# Patient Record
Sex: Female | Born: 1970 | Race: White | Hispanic: No | Marital: Married | Smoking: Never smoker
Health system: Southern US, Community
[De-identification: ages and names within clinical notes are randomized; demographics above are authoritative.]

## PROBLEM LIST (undated history)

## (undated) DIAGNOSIS — J302 Other seasonal allergic rhinitis: Secondary | ICD-10-CM

## (undated) DIAGNOSIS — F419 Anxiety disorder, unspecified: Secondary | ICD-10-CM

## (undated) DIAGNOSIS — F988 Other specified behavioral and emotional disorders with onset usually occurring in childhood and adolescence: Secondary | ICD-10-CM

## (undated) HISTORY — PX: INSERTION / PLACEMENT / REVISION NEUROSTIMULATOR: SUR720

## (undated) HISTORY — PX: GASTRIC BYPASS: SHX52

## (undated) HISTORY — PX: TUBAL LIGATION: SHX77

## (undated) HISTORY — PX: CHOLECYSTECTOMY: SHX55

---

## 2004-05-27 ENCOUNTER — Encounter (INDEPENDENT_AMBULATORY_CARE_PROVIDER_SITE_OTHER): Payer: Self-pay | Admitting: Specialist

## 2004-05-27 ENCOUNTER — Ambulatory Visit (HOSPITAL_COMMUNITY): Admission: RE | Admit: 2004-05-27 | Discharge: 2004-05-28 | Payer: Self-pay | Admitting: *Deleted

## 2008-11-11 ENCOUNTER — Encounter: Payer: Self-pay | Admitting: Occupational Medicine

## 2008-11-11 ENCOUNTER — Ambulatory Visit: Payer: Self-pay | Admitting: Family Medicine

## 2008-11-11 DIAGNOSIS — S139XXA Sprain of joints and ligaments of unspecified parts of neck, initial encounter: Secondary | ICD-10-CM | POA: Insufficient documentation

## 2008-11-11 DIAGNOSIS — S335XXA Sprain of ligaments of lumbar spine, initial encounter: Secondary | ICD-10-CM | POA: Insufficient documentation

## 2008-11-11 DIAGNOSIS — K219 Gastro-esophageal reflux disease without esophagitis: Secondary | ICD-10-CM | POA: Insufficient documentation

## 2008-11-12 ENCOUNTER — Telehealth (INDEPENDENT_AMBULATORY_CARE_PROVIDER_SITE_OTHER): Payer: Self-pay | Admitting: *Deleted

## 2009-02-28 ENCOUNTER — Ambulatory Visit: Payer: Self-pay | Admitting: Gynecology

## 2009-02-28 ENCOUNTER — Encounter: Payer: Self-pay | Admitting: Gynecology

## 2009-02-28 ENCOUNTER — Other Ambulatory Visit: Admission: RE | Admit: 2009-02-28 | Discharge: 2009-02-28 | Payer: Self-pay | Admitting: Gynecology

## 2009-03-07 ENCOUNTER — Ambulatory Visit: Payer: Self-pay | Admitting: Gynecology

## 2009-03-07 ENCOUNTER — Encounter: Payer: Self-pay | Admitting: Gynecology

## 2009-03-17 ENCOUNTER — Ambulatory Visit: Payer: Self-pay | Admitting: Gynecology

## 2009-03-20 ENCOUNTER — Ambulatory Visit: Payer: Self-pay | Admitting: Gynecology

## 2009-03-20 ENCOUNTER — Ambulatory Visit (HOSPITAL_BASED_OUTPATIENT_CLINIC_OR_DEPARTMENT_OTHER): Admission: RE | Admit: 2009-03-20 | Discharge: 2009-03-20 | Payer: Self-pay | Admitting: Gynecology

## 2009-03-20 ENCOUNTER — Encounter: Payer: Self-pay | Admitting: Gynecology

## 2009-04-02 ENCOUNTER — Ambulatory Visit: Payer: Self-pay | Admitting: Gynecology

## 2009-04-07 ENCOUNTER — Ambulatory Visit: Payer: Self-pay | Admitting: Gynecology

## 2011-02-02 NOTE — H&P (Signed)
Erika Brooks, Erika Brooks                ACCOUNT NO.:  [id number]   MEDICAL RECORD NO.:  [id number]          PATIENT TYPE:  AMB   LOCATION:  [facility]                         FACILITY:  [facility]   PHYSICIAN:  Timothy P. Fontaine, M.D.DATE OF BIRTH:  1971/02/10   DATE OF ADMISSION:  DATE OF DISCHARGE:                              HISTORY & PHYSICAL   DATE OF SURGERY:  July, 10, 2010 at 1 p.m. at [facility].   CHIEF COMPLAINT:  Menorrhagia, endometrial polyp.   HISTORY OF PRESENT ILLNESS:  A 40 year old G2, P2 female status post  tubal sterilization presents complaining of heavier menses.  The  patient's evaluation included ultrasound which showed a posterior polyp  measuring 23x13x10 mm,  endometrial biopsy showing proliferative  endometrium, a normal Pap smear, and normal blood work to include  prolactin and thyroid panel.  The patient is admitted at this time for  hysteroscopy D and C removal of her polyp.   PAST MEDICAL HISTORY:  Migraines.   PAST SURGICAL HISTORY:  Cholecystectomy, tubal sterilization.   CURRENT MEDICATIONS:  Zegerid and Remeron.   ALLERGIES:  No medications.   REVIEW OF SYSTEMS:  Noncontributory.   SOCIAL HISTORY:  Noncontributory.   FAMILY HISTORY:  Noncontributory.   PHYSICAL EXAM:  VITAL SIGNS:  Afebrile. Vital signs stable.  HEENT: Normal.  LUNGS:  Clear.  CARDIAC:  Regular rate.  No rubs, murmurs or gallops.  ABDOMEN:  Exam benign.  PELVIC:  External BUS, vagina normal.  Cervix normal.  Uterus of normal  size, midline and mobile. Adnexa without masses or tenderness.   ASSESSMENT:  A 40 year old G2, P2 female worsening menses,  sonohysterogram suggestive of endometrial polyp for hysteroscopy D and  C.  I reviewed the proposed surgery with the patient to include what is  involved with instrumentation, use of hysteroscope, resectoscope, D and  C portion, and the risks of infection, prolonged antibiotics, hemorrhage  necessitating  transfusion, risks of transfusion, uterine perforation,  damage to internal organs, bowel, bladder, ureters, vessels and nerves  necessitating major exploratory reparative surgeries, future reparative  surgeries, including bowel resection, bladder repair, ureteral damage  repair, and ostomy formation were all  discussed, understood and accepted.  The risks of distended media  absorption leading to metabolic complications such as coma or seizures  were also reviewed, understood and accepted.  The patient's questions  were answered to her satisfaction.  She is ready to proceed with  surgery.      Timothy P. Fontaine, M.D.  Electronically Signed     TPF/MEDQ  D:  03/19/2009  T:  03/20/2009  Job:  [id number]

## 2011-02-02 NOTE — Op Note (Signed)
NAMEAYSIA, LOWDER                ACCOUNT NO.:  [id number]   MEDICAL RECORD NO.:  [id number]          PATIENT TYPE:  AMB   LOCATION:  [facility]                         FACILITY:  [facility]   PHYSICIAN:  Timothy P. Fontaine, M.D.DATE OF BIRTH:  [birth year]   DATE OF PROCEDURE:  03/20/2009  DATE OF DISCHARGE:                               OPERATIVE REPORT   PREOPERATIVE DIAGNOSES:  1. Menorrhagia.  2. Endometrial polyp.   POSTOPERATIVE DIAGNOSES:  1. Menorrhagia.  2. Endometrial polyp.   PROCEDURES:  1. Hysteroscopy.  2. Endometrial polypectomy.  3. Dilatation and curettage.   SURGEON:  Timothy P. Fontaine, M.D.   ANESTHETIC:  General.   ESTIMATED BLOOD LOSS:  Minimal.   SORBITOL DISCREPANCY:  Minimal.   COMPLICATIONS:  None.   SPECIMEN:  1. Endometrial curetting.  2. Endometrial polyp to pathology.   FINDINGS:  EUA:  External, BUS, vagina normal.  Cervix normal.  Bimanual:  Uterus normal size, midline and mobile.  Adnexa without  masses.  Hysteroscopic with broad-based posterior lower uterine segment  endometrial polyp with a smaller, narrow-based polyp also noted in the  lower posterior endometrial surface.  Hysteroscopy was otherwise normal,  noting fundus, anterior-posterior uterine surfaces, right and left tubal  ostia, lower uterine segment and endocervical canal all visualized.   PROCEDURE:  The patient was taken to the operating room, underwent  general anesthesia, was placed in low dorsal lithotomy position,  received a perineal, vaginal preparation with Betadine solution.  EUA  performed and the patient was draped in the usual fashion, having voided  immediately previous to the surgery.  Cervix was visualized with a  weighted speculum, anterior lip grasped with a single-tooth tenaculum  and a paracervical block using 1% lidocaine was placed, a total of 9 cc.  Cervix was gently gradually dilated to admit the operative hysteroscope  and hysteroscopy was performed  with findings noted above.  The polyps  were resected to the level of the surrounding endometrium using the  right-angle resectoscope loop in several passes.  These were sent to  pathology.  A sharp curettage was then performed, the specimen sent to  pathology.  Re-hysteroscopy showed an empty cavity, good distention, no  evidence of perforation.  The patient was placed in the supine position  after removal of the tenaculum and speculum, showing adequate  hemostasis.  She was awakened without difficulty.  She received  intraoperative Toradol was taken to the recovery room in good condition,  having tolerated the procedure well.      Timothy P. Fontaine, M.D.  Electronically Signed     TPF/MEDQ  D:  03/20/2009  T:  03/20/2009  Job:  [id number]

## 2011-02-05 NOTE — Op Note (Signed)
[name], Erika Brooks                            ACCOUNT NO.:  [id number]   MEDICAL RECORD NO.:  [id number]                   PATIENT TYPE:  OIB   LOCATION:  2550                                 FACILITY:  [facility]   PHYSICIAN:  Vikki Ports, M.D.         DATE OF BIRTH:  [birth year]   DATE OF PROCEDURE:  05/27/2004  DATE OF DISCHARGE:                                 OPERATIVE REPORT   PREOPERATIVE DIAGNOSIS:  Atypical biliary colic.   POSTOPERATIVE DIAGNOSIS:  Atypical biliary colic, normal cholangiogram.   PROCEDURE:  Laparoscopic cholecystectomy with intraoperative cholangiogram.   SURGEON:  Vikki Ports, M.D.   ASSISTANT:  Jimmye Norman, M.D.   ANESTHESIA:  General.   DESCRIPTION OF PROCEDURE:  The patient was taken to the operating room and  placed in the supine position.  After adequate general anesthesia was  induced using an endotracheal tube, the abdomen was prepped and draped in  the normal sterile fashion.  Using a transverse central umbilical incision,  I dissected down to the fascia.  The fascia was opened vertically.  An 0  Vicryl pursestring suture was placed on the fascial defect.  Hassan trocar  was placed in the abdomen and pneumoperitoneum was obtained.  Under direct  visualization, an 11 mm trocar was placed in the subxiphoid region.  Two 5  mm ports were placed in the right abdomen.  Gallbladder was identified, did  not appear pathologic.  Dissection of the neck began by isolating the cystic  duct creating a good window posterior to it, clipping it on the gallbladder,  making a small ductotomy and performing cholangiogram.  This showed filling  of common bile duct, hepatic duct, and all biliary radicals with free flow  into the duodenum.  There were no filling defects.  Cholangi catheter was  removed.  Cystic duct was then doubly clipped and divided.  Cystic artery  was identified in a similar fashion, clipped and divided.  Gallbladder was  taken  off the gallbladder bed using Bovie electrocautery, placed in an  EndoCatch bag and removed through the umbilical port.  Pneumoperitoneum was  released after adequate hemostasis was insured.  Infraumbilical fascial  defect was closed with the O Vicryl pursestring sutures.  Skin incisions  were closed with subcuticular 4-0 Monocryl.  Steri-Strips and sterile  dressings were applied.  The patient tolerated the procedure well and went  to PACU in good condition.                                               Vikki Ports, M.D.    KRH/MEDQ  D:  05/27/2004  T:  05/27/2004  Job:  [id number]

## 2015-07-15 ENCOUNTER — Other Ambulatory Visit (HOSPITAL_BASED_OUTPATIENT_CLINIC_OR_DEPARTMENT_OTHER): Payer: Self-pay | Admitting: Family Medicine

## 2015-07-15 DIAGNOSIS — Z1231 Encounter for screening mammogram for malignant neoplasm of breast: Secondary | ICD-10-CM

## 2015-07-18 ENCOUNTER — Ambulatory Visit (HOSPITAL_BASED_OUTPATIENT_CLINIC_OR_DEPARTMENT_OTHER): Payer: Self-pay

## 2019-05-11 ENCOUNTER — Encounter: Payer: Self-pay | Admitting: Emergency Medicine

## 2019-05-11 ENCOUNTER — Other Ambulatory Visit: Payer: Self-pay

## 2019-05-11 ENCOUNTER — Emergency Department (INDEPENDENT_AMBULATORY_CARE_PROVIDER_SITE_OTHER)
Admission: EM | Admit: 2019-05-11 | Discharge: 2019-05-11 | Disposition: A | Discharge status: Home / Self Care | Payer: 59 | Source: Home / Self Care

## 2019-05-11 DIAGNOSIS — L282 Other prurigo: Secondary | ICD-10-CM

## 2019-05-11 HISTORY — DX: Anxiety disorder, unspecified: F41.9

## 2019-05-11 HISTORY — DX: Other seasonal allergic rhinitis: J30.2

## 2019-05-11 HISTORY — DX: Other specified behavioral and emotional disorders with onset usually occurring in childhood and adolescence: F98.8

## 2019-05-11 MED ORDER — HYDROXYZINE HCL 25 MG PO TABS: 25.0000 mg | ORAL_TABLET | Freq: Four times a day (QID) | ORAL | 0 refills | Status: DC

## 2019-05-11 MED ORDER — PREDNISONE 50 MG PO TABS: 50.0000 mg | ORAL_TABLET | Freq: Every day | ORAL | 0 refills | Status: AC

## 2019-05-11 MED ORDER — DEXAMETHASONE SODIUM PHOSPHATE 10 MG/ML IJ SOLN
10.0000 mg | Freq: Once | INTRAMUSCULAR | Status: AC
  Administered 2019-05-11: 10 mg via INTRAMUSCULAR

## 2019-05-11 NOTE — ED Triage Notes (Signed)
Patient was treated for rash 2 weeks ago; just completed 9 day course of prednisone; 2 days ago rash re-appeared on back of neck and has spread; itching despite benadryl. Reports numerous derm allergies. She has not travelled past 4 weeks.

## 2019-05-11 NOTE — ED Provider Notes (Signed)
[facility]    CSN: [id number] Arrival date & time: 05/11/19  1013      History   Chief Complaint Chief Complaint  Patient presents with  . Rash    HPI Erika Brooks is a 48 y.o. female.   HPI Erika Brooks is a 48 y.o. female presenting to UC with c/o 2 days of itchy red rash that initially started 2 weeks ago after borrowing a coworker's sweater and believes she had a reaction because the sweater was washed in [place]. Pt has sensitive skin. She completed a 9 day course of prednisone, 3 pills once daily (pt unsure of the dose). Rash and itching returned shortly after completing the medication. She has taken benadryl and used cortisone cream w/o relief. Itching is all over the back of her neck into the back of her scalp and both ears. No other new soaps, lotions or medications. She does recall getting a new lounging pillow and was unable to pre-wash it before using due to the type of pillow it was but she has since given it to her daughter in case that was the cause of her rash. Denies fever, chills, sore or scratchy throat, SOB or oral swelling.    Past Medical History:  Diagnosis Date  . ADD (attention deficit disorder)   . Anxiety   . Seasonal allergies     Patient Active Problem List   Diagnosis Date Noted  . GERD 11/11/2008  . NECK SPRAIN AND STRAIN 11/11/2008  . LUMBAR SPRAIN AND STRAIN 11/11/2008    Past Surgical History:  Procedure Laterality Date  . CHOLECYSTECTOMY    . GASTRIC BYPASS    . INSERTION / PLACEMENT / REVISION NEUROSTIMULATOR    . TUBAL LIGATION      OB History   No obstetric history on file.      Home Medications    Prior to Admission medications   Medication Sig Start Date End Date Taking? Authorizing Provider  amphetamine-dextroamphetamine (ADDERALL) 10 MG tablet Take 10 mg by mouth daily with breakfast.   Yes [provider]  amphetamine-dextroamphetamine (ADDERALL) 5 MG tablet Take 5 mg by mouth daily.   Yes [provider]  cholecalciferol (VITAMIN D3) 25 MCG (1000 UT) tablet Take 50,000 Units by mouth daily.   Yes [provider]  Cyanocobalamin (VITAMIN B 12) 250 MCG LOZG Inject into the muscle.   Yes [provider]  DULoxetine (CYMBALTA) 60 MG capsule Take 120 mg by mouth daily.   Yes [provider]  guanFACINE (TENEX) 2 MG tablet Take 2 mg by mouth at bedtime.   Yes [provider]  linaclotide (LINZESS) 145 MCG CAPS capsule Take 145 mcg by mouth daily before breakfast.   Yes [provider]  loratadine (CLARITIN) 10 MG tablet Take 10 mg by mouth daily.   Yes [provider]  QUEtiapine (SEROQUEL) 100 MG tablet Take 150 mg by mouth at bedtime.   Yes [provider]  tiZANidine (ZANAFLEX) 4 MG capsule Take 4 mg by mouth 3 (three) times daily.   Yes [provider]  vorinostat (ZOLINZA) 100 MG capsule Take 400 mg by mouth daily. Take with food.   Yes [provider]  zaleplon (SONATA) 10 MG capsule Take 10 mg by mouth at bedtime as needed for sleep.   Yes [provider]  zolpidem (AMBIEN CR) 12.5 MG CR tablet Take 12.5 mg by mouth at bedtime as needed for sleep.   Yes [provider]  hydrOXYzine (ATARAX/VISTARIL) 25 MG tablet Take 1 tablet (25 mg total) by mouth every 6 (six) hours. 05/11/19   Noe Gens, PA-C  predniSONE (DELTASONE) 50 MG tablet Take 1 tablet (50 mg total) by mouth daily with breakfast for 5 days. 05/11/19 05/16/19  Noe Gens, PA-C    Family History No family history on file.  Social History Social History   Tobacco Use  . Smoking status: Never Smoker  . Smokeless tobacco: Never Used  Substance Use Topics  . Alcohol use: Not Currently  . Drug use: Not on file     Allergies   Patient has no known allergies.   Review of Systems Review of Systems  Constitutional: Negative for chills and fever.  HENT: Negative for sore throat and trouble swallowing.    Respiratory: Negative for chest tightness, shortness of breath and wheezing.   Gastrointestinal: Negative for nausea and vomiting.  Musculoskeletal: Negative for arthralgias, joint swelling and myalgias.  Skin: Positive for rash.     Physical Exam Triage Vital Signs ED Triage Vitals  Enc Vitals Group     BP 05/11/19 1204 131/84     Pulse Rate 05/11/19 1204 74     Resp 05/11/19 1204 18     Temp 05/11/19 1204 98.1 F (36.7 C)     Temp Source 05/11/19 1204 Oral     SpO2 05/11/19 1204 100 %     Weight 05/11/19 1205 189 lb (85.7 kg)     Height 05/11/19 1205 5\' 9"  (1.753 m)     Head Circumference --      Peak Flow --      Pain Score 05/11/19 1204 0     Pain Loc --      Pain Edu? --      Excl. in [place]? --    No data found.  Updated Vital Signs BP 131/84 (BP Location: Right Arm)   Pulse 74   Temp 98.1 F (36.7 C) (Oral)   Resp 18   Ht 5\' 9"  (1.753 m)   Wt 189 lb (85.7 kg)   SpO2 100%   BMI 27.91 kg/m   Visual Acuity Right Eye Distance:   Left Eye Distance:   Bilateral Distance:    Right Eye Near:   Left Eye Near:    Bilateral Near:     Physical Exam Vitals signs and nursing note reviewed.  Constitutional:      Appearance: Normal appearance. She is well-developed.  HENT:     Head: Normocephalic and atraumatic.     Right Ear: Tympanic membrane and ear canal normal.     Left Ear: Tympanic membrane and ear canal normal.     Nose: Nose normal.     Mouth/Throat:     Lips: Pink.     Mouth: Mucous membranes are moist.     Pharynx: Oropharynx is clear. Uvula midline.     Comments: Bilateral external ears: faint erythematous fine papular rash. No blisters.  Non-tender. No oral swelling. Neck:     Musculoskeletal: Normal range of motion.  Cardiovascular:     Rate and Rhythm: Normal rate and regular rhythm.  Pulmonary:     Effort: Pulmonary effort is normal. No respiratory distress.     Breath sounds: Normal breath sounds. No stridor. No wheezing.  Musculoskeletal:  Normal range of motion.  Skin:    General: Skin is warm and dry.     Findings: Erythema and rash present.     Comments: Posterior neck and  along hairline of base of scalp: fine erythematous papular rash. Does blanch. Non-tender.   Neurological:     Mental Status: She is alert and oriented to person, place, and time.  Psychiatric:        Behavior: Behavior normal.      UC Treatments / Results  Labs (all labs ordered are listed, but only abnormal results are displayed) Labs Reviewed - No data to display  EKG   Radiology No results found.  Procedures Procedures (including critical care time)  Medications Ordered in UC Medications  dexamethasone (DECADRON) injection 10 mg (10 mg Intramuscular Given 05/11/19 1213)    Initial Impression / Assessment and Plan / UC Course  I have reviewed the triage vital signs and the nursing notes.  Pertinent labs & imaging results that were available during my care of the patient were reviewed by me and considered in my medical decision making (see chart for details).     Pt given decadron 10mg  IM Will also discharge with another 5 days of prednisone 50mg  once daily And atarax for severe itching AVS provided.  Final Clinical Impressions(s) / UC Diagnoses   Final diagnoses:  Pruritic rash     Discharge Instructions      You were given a shot of decadron (a steroid) today to help with itching and rash from a likely allergic reaction.  You have been prescribed 5 days of prednisone, an oral steroid.  You may start this medication tomorrow with breakfast.    Atarax (hydroxizine) is an antihistamine that can be taken to help with itching. This medication can cause drowsiness so do not drive or drink alcohol while taking.    You may try soaking in an oatmeal bath or using [place] bath soak to help relieve some itching. Be sure to use warm, rather than hot water, and pat dry your skin, do not rub as this can cause further skin irritation.    Please follow up with your family doctor next week if still not improving.    ED Prescriptions    Medication Sig Dispense Auth. Provider   predniSONE (DELTASONE) 50 MG tablet Take 1 tablet (50 mg total) by mouth daily with breakfast for 5 days. 5 tablet Waylan RocherPhelps, Roberta Kelly O, PA-C   hydrOXYzine (ATARAX/VISTARIL) 25 MG tablet Take 1 tablet (25 mg total) by mouth every 6 (six) hours. 12 tablet Lurene ShadowPhelps, Andrews Tener O, PA-C     Controlled Substance Prescriptions [place] Controlled Substance Registry consulted? Not Applicable   Rolla Platehelps, Alexandera Kuntzman O, PA-C 05/11/19 1744

## 2019-05-11 NOTE — Discharge Instructions (Signed)
°  You were given a shot of decadron (a steroid) today to help with itching and rash from a likely allergic reaction.  You have been prescribed 5 days of prednisone, an oral steroid.  You may start this medication tomorrow with breakfast.    Atarax (hydroxizine) is an antihistamine that can be taken to help with itching. This medication can cause drowsiness so do not drive or drink alcohol while taking.    You may try soaking in an oatmeal bath or using [place] bath soak to help relieve some itching. Be sure to use warm, rather than hot water, and pat dry your skin, do not rub as this can cause further skin irritation.   Please follow up with your family doctor next week if still not improving.

## 2022-03-17 ENCOUNTER — Emergency Department (INDEPENDENT_AMBULATORY_CARE_PROVIDER_SITE_OTHER)
Admission: EM | Admit: 2022-03-17 | Discharge: 2022-03-17 | Disposition: A | Discharge status: Home / Self Care | Payer: 59 | Source: Home / Self Care | Attending: Family Medicine | Admitting: Family Medicine

## 2022-03-17 ENCOUNTER — Encounter: Payer: Self-pay | Admitting: Emergency Medicine

## 2022-03-17 DIAGNOSIS — M7918 Myalgia, other site: Secondary | ICD-10-CM

## 2022-03-17 DIAGNOSIS — M542 Cervicalgia: Secondary | ICD-10-CM

## 2022-03-17 MED ORDER — METHYLPREDNISOLONE 4 MG PO TBPK: ORAL_TABLET | ORAL | 0 refills | Status: AC

## 2022-03-17 MED ORDER — TIZANIDINE HCL 4 MG PO CAPS: 4.0000 mg | ORAL_CAPSULE | Freq: Three times a day (TID) | ORAL | 0 refills | Status: AC | PRN

## 2022-03-17 NOTE — ED Triage Notes (Signed)
Pain to right side of neck Friday night  Pt states she feels like she turned wrong & maybe pulled a muscle  Pt can not take ibuprofen due to gastric bypass Pt has been using a travel pillow Pain is better but tylenol is providing minimal relief  Pt will need a work note  Works as a Lawyer w/ the elderly

## 2022-03-17 NOTE — Discharge Instructions (Signed)
Take the Medrol Dosepak as directed.  This is a steroid anti-inflammatory.  Take all of day 1 today (3 now and then 3 at bedtime.  Take the steroids with food Take Zanaflex muscle relaxer as needed Ice to area See your doctor if not improving by next week

## 2022-03-17 NOTE — ED Provider Notes (Signed)
[facility]    CSN: [id number] Arrival date & time: 03/17/22  1754      History   Chief Complaint Chief Complaint  Patient presents with   Neck Pain    Right side     HPI Erika Brooks is a 51 y.o. female.   HPI  Patient is here for neck pain.  Is been going on for 3 to 4 days.  On the right side of her neck.  It is in the neck muscles on upper part of her shoulder (points to trapezius.  Has pain with neck movement.  Pain with lifting and activities of her arms.  On the right side only.  No numbness or weakness into the arm.  No history of degenerative disc disease or known neck condition.  Past medical history, taken from chart, includes paroxysmal supraventricular tachycardia, gastroesophageal disease without esophagitis, history of gastric bypass, migraines, chronic pain syndrome, myofascial pain, iron deficiency anemia multiple joint pain lumbar sacral spondylosis and cervical facet joint syndrome  Medications are listed below  Past Medical History:  Diagnosis Date   ADD (attention deficit disorder)    Anxiety    Seasonal allergies     Patient Active Problem List   Diagnosis Date Noted   GERD 11/11/2008   NECK SPRAIN AND STRAIN 11/11/2008   LUMBAR SPRAIN AND STRAIN 11/11/2008    Past Surgical History:  Procedure Laterality Date   CHOLECYSTECTOMY     GASTRIC BYPASS     INSERTION / PLACEMENT / REVISION NEUROSTIMULATOR     TUBAL LIGATION      OB History   No obstetric history on file.      Home Medications    Prior to Admission medications   Medication Sig Start Date End Date Taking? Authorizing Provider  diclofenac Sodium (VOLTAREN) 1 % GEL PLACE ONTO THE SKIN 4 (FOUR) TIMES A DAY AS NEEDED. 05/08/15  Yes [provider]  estradiol (CLIMARA - DOSED IN MG/24 HR) 0.05 mg/24hr patch Place onto the skin. 07/28/21  Yes [provider]  estrogen, conjugated,-medroxyprogesterone (PREMPRO) 0.625-5 MG tablet Take 1 tablet by mouth  daily. 05/12/20  Yes [provider]  lisinopril (ZESTRIL) 40 MG tablet Take by mouth. 09/24/20  Yes [provider]  methylPREDNISolone (MEDROL DOSEPAK) 4 MG TBPK tablet tad 03/17/22  Yes Eustace Moore, MD  metoprolol succinate (TOPROL-XL) 25 MG 24 hr tablet Take 1 tablet by mouth daily. 12/28/21  Yes [provider]  Multiple Vitamin (THERA) TABS Take by mouth. 01/05/16  Yes [provider]  omeprazole (PRILOSEC) 40 MG capsule Take by mouth. 04/11/15  Yes [provider]  progesterone (PROMETRIUM) 100 MG capsule Take by mouth. 07/28/21  Yes [provider]  Sodium Fluoride (SODIUM FLUORIDE 5000 PPM) 1.1 % PSTE Brush twice daily and spit. Do not swallow. 05/11/21  Yes [provider]  amantadine (SYMMETREL) 100 MG capsule Take by mouth. 02/17/22   [provider]  amphetamine-dextroamphetamine (ADDERALL) 10 MG tablet Take 10 mg by mouth daily with breakfast.    [provider]  amphetamine-dextroamphetamine (ADDERALL) 5 MG tablet Take 5 mg by mouth daily.    [provider]  cholecalciferol (VITAMIN D3) 25 MCG (1000 UT) tablet Take 50,000 Units by mouth daily.    [provider]  Cyanocobalamin (VITAMIN B 12) 250 MCG LOZG Inject into the muscle.    [provider]  DULoxetine (CYMBALTA) 60 MG capsule Take 120 mg by mouth daily.    [provider]  estradiol (ESTRACE) 0.1 MG/GM vaginal cream Place vaginally. 12/07/21   [provider]  Lavender Oil OIL by Misc.(Non-Drug; Combo Route) route.    [provider]  loratadine (CLARITIN) 10 MG tablet Take 10 mg by mouth daily.    [provider]  QUEtiapine (SEROQUEL) 100 MG tablet Take 150 mg by mouth at bedtime.    [provider]  SUMAtriptan (IMITREX) 25 MG tablet SMARTSIG:1 Tablet(s) By Mouth Every 2 Hours PRN 02/17/22   [provider]  tiZANidine (ZANAFLEX) 4 MG capsule Take 1 capsule (4 mg  total) by mouth 3 (three) times daily as needed for muscle spasms. 03/17/22   Eustace Moore, MD  zolpidem (AMBIEN CR) 12.5 MG CR tablet Take 12.5 mg by mouth at bedtime as needed for sleep.    [provider]    Family History Family History  Problem Relation Age of Onset   Hypertension Mother    High Cholesterol Mother    Hypertension Father    High Cholesterol Father    Diverticulitis Father    Lung cancer Father     Social History Social History   Tobacco Use   Smoking status: Never    Passive exposure: Never   Smokeless tobacco: Never  Vaping Use   Vaping Use: Never used  Substance Use Topics   Alcohol use: Not Currently   Drug use: Never     Allergies   Morphine, Nsaids, and Nitrofurantoin   Review of Systems Review of Systems  See HPI Physical Exam Triage Vital Signs ED Triage Vitals  Enc Vitals Group     BP 03/17/22 1809 (!) 138/91     Pulse Rate 03/17/22 1809 75     Resp 03/17/22 1809 16     Temp 03/17/22 1809 99.4 F (37.4 C)     Temp Source 03/17/22 1809 Oral     SpO2 03/17/22 1809 98 %     Weight 03/17/22 1812 200 lb (90.7 kg)     Height 03/17/22 1812 5\' 9"  (1.753 m)     Head Circumference --      Peak Flow --      Pain Score 03/17/22 1811 6     Pain Loc --      Pain Edu? --      Excl. in GC? --    No data found.  Updated Vital Signs BP (!) 138/91 (BP Location: Left Arm)   Pulse 75   Temp 99.4 F (37.4 C) (Oral)   Resp 16   Ht 5\' 9"  (1.753 m)   Wt 90.7 kg   SpO2 98%   BMI 29.53 kg/m  :     Physical Exam Constitutional:      General: She is not in acute distress.    Appearance: She is well-developed.     Comments: Appears uncomfortable.  Guarded movements  HENT:     Head: Normocephalic and atraumatic.  Eyes:     Conjunctiva/sclera: Conjunctivae normal.     Pupils: Pupils are equal, round, and reactive to light.  Neck:     Comments: Tenderness in the right paraspinous cervical and right upper body of trapezius  down the medial border of the right scapula.  No bony tenderness.  Neck range of motion is slow but full Cardiovascular:     Rate and Rhythm: Normal rate.  Pulmonary:     Effort: Pulmonary effort is normal. No respiratory distress.  Abdominal:     General: There is  no distension.     Palpations: Abdomen is soft.  Musculoskeletal:        General: Normal range of motion.     Cervical back: Normal range of motion. Tenderness present.     Comments: Upper extremities have symmetric strength sensation range of motion and reflexes  Skin:    General: Skin is warm and dry.  Neurological:     General: No focal deficit present.     Mental Status: She is alert.  Psychiatric:        Mood and Affect: Mood normal.        Behavior: Behavior normal.      UC Treatments / Results  Labs (all labs ordered are listed, but only abnormal results are displayed) Labs Reviewed - No data to display  EKG   Radiology No results found.  Procedures Procedures (including critical care time)  Medications Ordered in UC Medications - No data to display  Initial Impression / Assessment and Plan / UC Course  I have reviewed the triage vital signs and the nursing notes.  Pertinent labs & imaging results that were available during my care of the patient were reviewed by me and considered in my medical decision making (see chart for details).     Muscular neck pain, discussed Final Clinical Impressions(s) / UC Diagnoses   Final diagnoses:  Musculoskeletal pain  Neck pain     Discharge Instructions      Take the Medrol Dosepak as directed.  This is a steroid anti-inflammatory.  Take all of day 1 today (3 now and then 3 at bedtime.  Take the steroids with food Take Zanaflex muscle relaxer as needed Ice to area See your doctor if not improving by next week   ED Prescriptions     Medication Sig Dispense Auth. Provider   tiZANidine (ZANAFLEX) 4 MG capsule Take 1 capsule (4 mg total) by mouth 3  (three) times daily as needed for muscle spasms. 20 capsule Eustace Moore, MD   methylPREDNISolone (MEDROL DOSEPAK) 4 MG TBPK tablet tad 21 tablet Eustace Moore, MD      PDMP not reviewed this encounter.   Eustace Moore, MD 03/17/22 [phone]
